# Patient Record
Sex: Female | Born: 1995 | Race: White | Hispanic: No | Marital: Single | Smoking: Current every day smoker
Health system: Southern US, Community
[De-identification: ages and names within clinical notes are randomized; demographics above are authoritative.]

---
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
## 2015-09-07 ENCOUNTER — Emergency Department (HOSPITAL_BASED_OUTPATIENT_CLINIC_OR_DEPARTMENT_OTHER)
Admission: EM | Admit: 2015-09-07 | Discharge: 2015-09-08 | Disposition: A | Discharge status: Home / Self Care | Payer: BLUE CROSS/BLUE SHIELD | Attending: Emergency Medicine | Admitting: Emergency Medicine

## 2015-09-07 ENCOUNTER — Encounter (HOSPITAL_BASED_OUTPATIENT_CLINIC_OR_DEPARTMENT_OTHER): Payer: Self-pay | Admitting: Emergency Medicine

## 2015-09-07 DIAGNOSIS — F17210 Nicotine dependence, cigarettes, uncomplicated: Secondary | ICD-10-CM | POA: Diagnosis not present

## 2015-09-07 DIAGNOSIS — R1030 Lower abdominal pain, unspecified: Secondary | ICD-10-CM | POA: Diagnosis present

## 2015-09-07 DIAGNOSIS — R1084 Generalized abdominal pain: Secondary | ICD-10-CM

## 2015-09-07 LAB — URINALYSIS, ROUTINE W REFLEX MICROSCOPIC
Bilirubin Urine: NEGATIVE
GLUCOSE, UA: NEGATIVE mg/dL
HGB URINE DIPSTICK: NEGATIVE
KETONES UR: NEGATIVE mg/dL
LEUKOCYTES UA: NEGATIVE
Nitrite: NEGATIVE
PH: 5.5 (ref 5.0–8.0)
Protein, ur: NEGATIVE mg/dL
Specific Gravity, Urine: 1.019 (ref 1.005–1.030)

## 2015-09-07 LAB — PREGNANCY, URINE: Preg Test, Ur: NEGATIVE

## 2015-09-07 MED ORDER — KETOROLAC TROMETHAMINE 30 MG/ML IJ SOLN
30.0000 mg | Freq: Once | INTRAMUSCULAR | Status: AC
  Administered 2015-09-08: 30 mg via INTRAVENOUS
  Filled 2015-09-07: qty 1

## 2015-09-07 MED ORDER — ONDANSETRON HCL 4 MG/2ML IJ SOLN
4.0000 mg | Freq: Once | INTRAMUSCULAR | Status: AC
  Administered 2015-09-08: 4 mg via INTRAVENOUS
  Filled 2015-09-07: qty 2

## 2015-09-07 MED ORDER — SODIUM CHLORIDE 0.9 % IV BOLUS (SEPSIS)
1000.0000 mL | Freq: Once | INTRAVENOUS | Status: AC
  Administered 2015-09-08: 1000 mL via INTRAVENOUS

## 2015-09-07 NOTE — ED Provider Notes (Signed)
By signing my name below, I, Maria Tan, attest that this documentation has been prepared under the direction and in the presence of Dionis Autry N Psalm Schappell, DO. Electronically Signed: Bridgette Habermann, ED Scribe. 09/07/15. 11:51 PM.  TIME SEEN:  First MD Initiated Contact with Patient 09/07/15 2346    CHIEF COMPLAINT:  Chief Complaint  Patient presents with  . Abdominal Pain    HPI:  HPI Comments: Rasheena Talmadge is a 20 y.o. female who presents to the Emergency Department complaining of sudden onset, constant, cramping, lower abdominal pain onset 1 pm one day ago. Pt also has associated nausea. Pt has not had this pain before but notes she has h/o ovarian cysts. Pt notes this does not feel like a cyst. No alleviating factors noted. LNMP was one month ago but does report they are very irregular which is normal for her. Pt has no h/o STDs or abdominal surgeries. No known sick contacts with similar symptoms. No recent travel outside the country. Denies fever, chills, vomiting, diarrhea, vaginal discharge/bleeding, urinary symptoms, or any other associated symptoms.  She denies alleviating or aggravating factors. Has never had similar symptoms.  G: 1 P: 1 A: 0  ROS: See HPI Constitutional: no fever  Eyes: no drainage  ENT: no runny nose   Cardiovascular:  no chest pain  Resp: no SOB  GI: no vomiting GU: no dysuria Integumentary: no rash  Allergy: no hives  Musculoskeletal: no leg swelling  Neurological: no slurred speech ROS otherwise negative  PAST MEDICAL HISTORY/PAST SURGICAL HISTORY:  History reviewed. No pertinent past medical history.  MEDICATIONS:  Prior to Admission medications   Not on File    ALLERGIES:  Allergies  Allergen Reactions  . Penicillins   . Sulfa Antibiotics     SOCIAL HISTORY:  Social History  Substance Use Topics  . Smoking status: Current Every Day Smoker    Packs/day: 0.50    Types: Cigarettes  . Smokeless tobacco: Never Used  . Alcohol use No    FAMILY  HISTORY: History reviewed. No pertinent family history.  EXAM: BP 135/96 (BP Location: Left Arm)   Pulse 80   Temp 97.8 F (36.6 C) (Oral)   Resp 18   Ht  (1.626 m)   Wt 160 lb (72.6 kg)   LMP 08/08/2015   SpO2 100%   BMI 27.46 kg/m  CONSTITUTIONAL: Alert and oriented and responds appropriately to questions. Well-appearing; well-nourished HEAD: Normocephalic EYES: Conjunctivae clear, PERRL ENT: normal nose; no rhinorrhea; moist mucous membranes NECK: Supple, no meningismus, no LAD  CARD: RRR; S1 and S2 appreciated; no murmurs, no clicks, no rubs, no gallops RESP: Normal chest excursion without splinting or tachypnea; breath sounds clear and equal bilaterally; no wheezes, no rhonchi, no rales, no hypoxia or respiratory distress, speaking full sentences ABD/GI: Normal bowel sounds; non-distended; soft, diffusely tender to palpation without locality, no rebound, no guarding, no peritoneal signs GU:  Normal external genitalia. No lesions, rashes noted. Patient has no vaginal bleeding on exam. No significant vaginal discharge.  Small amount of mucus-like discharge coming from the cervix and cervix is slightly friable. No adnexal tenderness, mass or fullness, no cervical motion tenderness.   Cervix is closed.  Chaperone present for exam. BACK:  The back appears normal and is non-tender to palpation, there is no CVA tenderness EXT: Normal ROM in all joints; non-tender to palpation; no edema; normal capillary refill; no cyanosis, no calf tenderness or swBridgette Habermann    SKIN: Normal color for age and race;  warm; no rash NEURO: Moves all extremities equally, sensation to light touch intact diffusely, cranial nerves II through XII intact PSYCH: The patient's mood and manner are appropriate. Grooming and personal hygiene are appropriate.  MEDICAL DECISION MAKING: Patient here with lower abdominal pain. When I ask her where she is hurting mostly she points to the area around the umbilicus. She is  tender on exam diffusely without focal tenderness. Nonsurgical abdomen. Doubt cholecystitis, appendicitis, bowel obstruction based on her exam. Will obtain labs, urine and perform pelvic exam. Will give IV fluids, Toradol, Zofran and reassess.  ED PROGRESS: Pelvic exam unremarkable. Doubt TOA, PID, torsion based on her exam. Urine shows no sign of infection, no blood. She is not pregnant. Reports feeling much better after Toradol, Zofran.  1:00 AM  Patient's labs unremarkable other than leukocytosis of 10.7 without left shift. We'll fluid challenge patient and reassess.  1:30 AM  Pt is working without difficulty and reports pain is completely gone. Abdominal exam benign. I do not feel at this time she needs further emergent workup or imaging. Discuss with patient that this could be the beginning of her menstrual cycle, viral illness. Low suspicion for appendicitis but have discussed with her at length return precautions. She does have a PCP for follow-up. She is comfortable with this plan. We'll have her alternate Tylenol and ibuprofen at home for pain. We'll discharge with prescription for Zofran as well.   At this time, I do not feel there is any life-threatening condition present. I have reviewed and discussed all results (EKG, imaging, lab, urine as appropriate), exam findings with patient. I have reviewed nursing notes and appropriate previous records.  I feel the patient is safe to be discharged home without further emergent workup and can have further workup as an outpatient. Discussed usual and customary return precautions. Patient and family (if present) verbalize understanding and are comfortable with this plan.  Patient will follow-up with their primary care provider. If they do not have a primary care provider, information for follow-up has been provided to them. All questions have been answered.   I personally performed the services described in this documentation, which was scribed in my  presence. The recorded information has been reviewed and is accurate.     Layla Maw Korynne Dols, DO 09/08/15 [phone]

## 2015-09-07 NOTE — ED Triage Notes (Signed)
Generalized lower abdominal pain x 1 day with nausea.  Denies vaginal discharge.  Denies urinary symptoms.

## 2015-09-08 LAB — WET PREP, GENITAL
CLUE CELLS WET PREP: NONE SEEN
Sperm: NONE SEEN
Trich, Wet Prep: NONE SEEN
Yeast Wet Prep HPF POC: NONE SEEN

## 2015-09-08 LAB — COMPREHENSIVE METABOLIC PANEL
ALBUMIN: 4.1 g/dL (ref 3.5–5.0)
ALT: 12 U/L — AB (ref 14–54)
AST: 14 U/L — AB (ref 15–41)
Alkaline Phosphatase: 66 U/L (ref 38–126)
Anion gap: 7 (ref 5–15)
BILIRUBIN TOTAL: 0.4 mg/dL (ref 0.3–1.2)
BUN: 11 mg/dL (ref 6–20)
CO2: 23 mmol/L (ref 22–32)
CREATININE: 0.77 mg/dL (ref 0.44–1.00)
Calcium: 8.9 mg/dL (ref 8.9–10.3)
Chloride: 111 mmol/L (ref 101–111)
GFR calc Af Amer: >60 mL/min (ref >60)
GFR calc non Af Amer: >60 mL/min (ref >60)
GLUCOSE: 96 mg/dL (ref 65–99)
POTASSIUM: 3.6 mmol/L (ref 3.5–5.1)
Sodium: 141 mmol/L (ref 135–145)
TOTAL PROTEIN: 7 g/dL (ref 6.5–8.1)

## 2015-09-08 LAB — CBC WITH DIFFERENTIAL/PLATELET
BASOS ABS: 0 10*3/uL (ref 0.0–0.1)
BASOS PCT: 0 %
Eosinophils Absolute: 0.1 10*3/uL (ref 0.0–0.7)
Eosinophils Relative: 1 %
HEMATOCRIT: 40.8 % (ref 36.0–46.0)
HEMOGLOBIN: 14.3 g/dL (ref 12.0–15.0)
LYMPHS PCT: 27 %
Lymphs Abs: 2.9 10*3/uL (ref 0.7–4.0)
MCH: 28.3 pg (ref 26.0–34.0)
MCHC: 35 g/dL (ref 30.0–36.0)
MCV: 80.8 fL (ref 78.0–100.0)
MONO ABS: 0.6 10*3/uL (ref 0.1–1.0)
Monocytes Relative: 5 %
NEUTROS ABS: 7.2 10*3/uL (ref 1.7–7.7)
NEUTROS PCT: 67 %
Platelets: 274 10*3/uL (ref 150–400)
RBC: 5.05 MIL/uL (ref 3.87–5.11)
RDW: 13.5 % (ref 11.5–15.5)
WBC: 10.7 10*3/uL — AB (ref 4.0–10.5)

## 2015-09-08 LAB — LIPASE, BLOOD: Lipase: 18 U/L (ref 11–51)

## 2015-09-08 MED ORDER — ONDANSETRON 4 MG PO TBDP: 4.0000 mg | ORAL_TABLET | Freq: Three times a day (TID) | ORAL | 0 refills | Status: AC | PRN

## 2015-09-08 NOTE — Discharge Instructions (Signed)
Your labs, urine were normal today. You have had pelvic cultures that have been sent. If they are positive for any infection, you will be contacted in you and your partner will need to be treated. If your pain worsens, you develop fever, vomiting that will not stop, blood in your stool, please return to the hospital. I think you're safe to take Tylenol 1000 mg every 6 hours as needed for fever and pain and alternate this with ibuprofen 800 mg every 8 hours as needed for fever and pain. Please follow-up with your primary care physician if symptoms are not improving.

## 2015-09-08 NOTE — ED Notes (Signed)
C/o abd pain, also nausea and some back pain, rates 5/10, (denies: vd, fever, bleeding, dizziness, urinary or vaginal sx), started upper abd pain, thought it was heartburn, no relief with acid reducer med, last at 1500, "eating made pain worse", otherwise comes and goes, last BM today normal.

## 2015-09-08 NOTE — ED Notes (Signed)
Given PO fluids, drinking, "feel better", denies pain.

## 2015-09-08 NOTE — ED Notes (Signed)
Up to b/r, steady gait 

## 2015-09-09 LAB — GC/CHLAMYDIA PROBE AMP (~~LOC~~) NOT AT ARMC
CHLAMYDIA, DNA PROBE: NEGATIVE
NEISSERIA GONORRHEA: NEGATIVE

## 2018-11-13 ENCOUNTER — Emergency Department (HOSPITAL_BASED_OUTPATIENT_CLINIC_OR_DEPARTMENT_OTHER): Payer: BC Managed Care – PPO

## 2018-11-13 ENCOUNTER — Encounter (HOSPITAL_BASED_OUTPATIENT_CLINIC_OR_DEPARTMENT_OTHER): Payer: Self-pay | Admitting: Emergency Medicine

## 2018-11-13 ENCOUNTER — Other Ambulatory Visit: Payer: Self-pay

## 2018-11-13 ENCOUNTER — Emergency Department (HOSPITAL_BASED_OUTPATIENT_CLINIC_OR_DEPARTMENT_OTHER)
Admission: EM | Admit: 2018-11-13 | Discharge: 2018-11-13 | Disposition: A | Discharge status: Home / Self Care | Payer: BC Managed Care – PPO | Attending: Emergency Medicine | Admitting: Emergency Medicine

## 2018-11-13 DIAGNOSIS — Z882 Allergy status to sulfonamides status: Secondary | ICD-10-CM | POA: Diagnosis not present

## 2018-11-13 DIAGNOSIS — M542 Cervicalgia: Secondary | ICD-10-CM | POA: Insufficient documentation

## 2018-11-13 DIAGNOSIS — Z880 Allergy status to penicillin: Secondary | ICD-10-CM | POA: Insufficient documentation

## 2018-11-13 DIAGNOSIS — M25512 Pain in left shoulder: Secondary | ICD-10-CM | POA: Insufficient documentation

## 2018-11-13 DIAGNOSIS — F17210 Nicotine dependence, cigarettes, uncomplicated: Secondary | ICD-10-CM | POA: Diagnosis not present

## 2018-11-13 DIAGNOSIS — M546 Pain in thoracic spine: Secondary | ICD-10-CM | POA: Insufficient documentation

## 2018-11-13 MED ORDER — METHOCARBAMOL 500 MG PO TABS: 500.0000 mg | ORAL_TABLET | Freq: Two times a day (BID) | ORAL | 0 refills | Status: AC

## 2018-11-13 MED ORDER — NAPROXEN 500 MG PO TABS: 500.0000 mg | ORAL_TABLET | Freq: Two times a day (BID) | ORAL | 0 refills | Status: AC
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
